# Patient Record
Sex: Female | Born: 1955 | Race: Black or African American | Hispanic: No | Marital: Single | Smoking: Former smoker
Health system: Southern US, Community
[De-identification: ages and names within clinical notes are randomized; demographics above are authoritative.]

## PROBLEM LIST (undated history)

## (undated) DIAGNOSIS — I10 Essential (primary) hypertension: Secondary | ICD-10-CM

## (undated) DIAGNOSIS — E119 Type 2 diabetes mellitus without complications: Secondary | ICD-10-CM

---

## 2010-01-18 ENCOUNTER — Emergency Department (HOSPITAL_COMMUNITY): Admission: EM | Admit: 2010-01-18 | Discharge: 2010-01-18 | Payer: Self-pay | Admitting: Emergency Medicine

## 2010-04-22 ENCOUNTER — Ambulatory Visit: Payer: Self-pay | Admitting: Internal Medicine

## 2010-04-22 ENCOUNTER — Encounter (INDEPENDENT_AMBULATORY_CARE_PROVIDER_SITE_OTHER): Payer: Self-pay | Admitting: *Deleted

## 2010-04-22 LAB — CONVERTED CEMR LAB: Microalb, Ur: 0.97 mg/dL (ref 0.00–1.89)

## 2010-07-24 ENCOUNTER — Encounter (INDEPENDENT_AMBULATORY_CARE_PROVIDER_SITE_OTHER): Payer: Self-pay | Admitting: Internal Medicine

## 2010-07-24 LAB — CONVERTED CEMR LAB
ANA Titer 1: 1:640 {titer} — ABNORMAL HIGH
AST: 12 units/L (ref 0–37)
Anti Nuclear Antibody(ANA): POSITIVE — AB
BUN: 12 mg/dL (ref 6–23)
Basophils Relative: 0 % (ref 0–1)
CO2: 20 meq/L (ref 19–32)
Chloride: 105 meq/L (ref 96–112)
Eosinophils Absolute: 0.2 10*3/uL (ref 0.0–0.7)
Eosinophils Relative: 2 % (ref 0–5)
Glucose, Bld: 86 mg/dL (ref 70–99)
HCT: 44.8 % (ref 36.0–46.0)
Hemoglobin: 14.3 g/dL (ref 12.0–15.0)
Lymphocytes Relative: 23 % (ref 12–46)
Lymphs Abs: 2.1 10*3/uL (ref 0.7–4.0)
Monocytes Absolute: 0.4 10*3/uL (ref 0.1–1.0)
Platelets: 298 10*3/uL (ref 150–400)
RBC: 5.32 M/uL — ABNORMAL HIGH (ref 3.87–5.11)
Sed Rate: 4 mm/hr (ref 0–22)
Sodium: 136 meq/L (ref 135–145)
Total Bilirubin: 0.3 mg/dL (ref 0.3–1.2)
WBC: 9 10*3/uL (ref 4.0–10.5)

## 2011-03-25 ENCOUNTER — Other Ambulatory Visit: Payer: Self-pay | Admitting: Infectious Diseases

## 2011-03-25 ENCOUNTER — Ambulatory Visit
Admission: RE | Admit: 2011-03-25 | Discharge: 2011-03-25 | Disposition: A | Discharge status: Home / Self Care | Payer: Self-pay | Source: Ambulatory Visit | Attending: Infectious Diseases | Admitting: Infectious Diseases

## 2011-03-25 DIAGNOSIS — IMO0001 Reserved for inherently not codable concepts without codable children: Secondary | ICD-10-CM

## 2013-09-22 ENCOUNTER — Encounter (HOSPITAL_COMMUNITY): Payer: Self-pay | Admitting: Emergency Medicine

## 2013-09-22 ENCOUNTER — Emergency Department (HOSPITAL_COMMUNITY)
Admission: EM | Admit: 2013-09-22 | Discharge: 2013-09-22 | Disposition: A | Discharge status: Other Acute Inpatient Hospital | Payer: PRIVATE HEALTH INSURANCE | Source: Home / Self Care | Attending: Family Medicine | Admitting: Family Medicine

## 2013-09-22 ENCOUNTER — Emergency Department (HOSPITAL_COMMUNITY)
Admission: EM | Admit: 2013-09-22 | Discharge: 2013-09-22 | Disposition: A | Discharge status: Home / Self Care | Payer: PRIVATE HEALTH INSURANCE | Attending: Emergency Medicine | Admitting: Emergency Medicine

## 2013-09-22 ENCOUNTER — Emergency Department (HOSPITAL_COMMUNITY): Payer: PRIVATE HEALTH INSURANCE

## 2013-09-22 DIAGNOSIS — H532 Diplopia: Secondary | ICD-10-CM | POA: Insufficient documentation

## 2013-09-22 DIAGNOSIS — E119 Type 2 diabetes mellitus without complications: Secondary | ICD-10-CM | POA: Insufficient documentation

## 2013-09-22 DIAGNOSIS — R0602 Shortness of breath: Secondary | ICD-10-CM

## 2013-09-22 DIAGNOSIS — I10 Essential (primary) hypertension: Secondary | ICD-10-CM | POA: Insufficient documentation

## 2013-09-22 DIAGNOSIS — Z880 Allergy status to penicillin: Secondary | ICD-10-CM | POA: Insufficient documentation

## 2013-09-22 DIAGNOSIS — R51 Headache: Secondary | ICD-10-CM | POA: Insufficient documentation

## 2013-09-22 DIAGNOSIS — R0789 Other chest pain: Secondary | ICD-10-CM

## 2013-09-22 DIAGNOSIS — Z87891 Personal history of nicotine dependence: Secondary | ICD-10-CM | POA: Insufficient documentation

## 2013-09-22 DIAGNOSIS — H538 Other visual disturbances: Secondary | ICD-10-CM | POA: Insufficient documentation

## 2013-09-22 HISTORY — DX: Type 2 diabetes mellitus without complications: E11.9

## 2013-09-22 HISTORY — DX: Essential (primary) hypertension: I10

## 2013-09-22 LAB — DIFFERENTIAL
Basophils Absolute: 0 10*3/uL (ref 0.0–0.1)
Basophils Relative: 0 % (ref 0–1)
EOS ABS: 0.2 10*3/uL (ref 0.0–0.7)
Eosinophils Relative: 2 % (ref 0–5)
LYMPHS ABS: 2.3 10*3/uL (ref 0.7–4.0)
LYMPHS PCT: 28 % (ref 12–46)
Monocytes Absolute: 0.4 10*3/uL (ref 0.1–1.0)
Monocytes Relative: 5 % (ref 3–12)
NEUTROS ABS: 5.2 10*3/uL (ref 1.7–7.7)
NEUTROS PCT: 64 % (ref 43–77)

## 2013-09-22 LAB — URINALYSIS, ROUTINE W REFLEX MICROSCOPIC
Hgb urine dipstick: NEGATIVE
Ketones, ur: 40 mg/dL — AB
LEUKOCYTES UA: NEGATIVE
Nitrite: NEGATIVE
PROTEIN: NEGATIVE mg/dL
Specific Gravity, Urine: 1.039 — ABNORMAL HIGH (ref 1.005–1.030)
UROBILINOGEN UA: 1 mg/dL (ref 0.0–1.0)
pH: 5 (ref 5.0–8.0)

## 2013-09-22 LAB — COMPREHENSIVE METABOLIC PANEL
ALK PHOS: 124 U/L — AB (ref 39–117)
ALT: 10 U/L (ref 0–35)
AST: 9 U/L (ref 0–37)
Albumin: 3.8 g/dL (ref 3.5–5.2)
BUN: 18 mg/dL (ref 6–23)
CHLORIDE: 96 meq/L (ref 96–112)
CO2: 20 mEq/L (ref 19–32)
Calcium: 10.1 mg/dL (ref 8.4–10.5)
Creatinine, Ser: 0.63 mg/dL (ref 0.50–1.10)
GFR calc Af Amer: >90 mL/min (ref >90)
GFR calc non Af Amer: >90 mL/min (ref >90)
GLUCOSE: 383 mg/dL — AB (ref 70–99)
Potassium: 4.1 mEq/L (ref 3.7–5.3)
SODIUM: 135 meq/L — AB (ref 137–147)
TOTAL PROTEIN: 7.1 g/dL (ref 6.0–8.3)
Total Bilirubin: 0.2 mg/dL — ABNORMAL LOW (ref 0.3–1.2)

## 2013-09-22 LAB — POCT I-STAT, CHEM 8
BUN: 16 mg/dL (ref 6–23)
CREATININE: 0.8 mg/dL (ref 0.50–1.10)
Calcium, Ion: 1.31 mmol/L — ABNORMAL HIGH (ref 1.12–1.23)
Chloride: 100 mEq/L (ref 96–112)
GLUCOSE: 390 mg/dL — AB (ref 70–99)
HEMATOCRIT: 47 % — AB (ref 36.0–46.0)
HEMOGLOBIN: 16 g/dL — AB (ref 12.0–15.0)
Potassium: 4.2 mEq/L (ref 3.7–5.3)
Sodium: 137 mEq/L (ref 137–147)
TCO2: 22 mmol/L (ref 0–100)

## 2013-09-22 LAB — RAPID URINE DRUG SCREEN, HOSP PERFORMED
AMPHETAMINES: NOT DETECTED
Barbiturates: NOT DETECTED
Benzodiazepines: NOT DETECTED
Cocaine: NOT DETECTED
Opiates: NOT DETECTED
TETRAHYDROCANNABINOL: NOT DETECTED

## 2013-09-22 LAB — CBC
HCT: 41.4 % (ref 36.0–46.0)
Hemoglobin: 14.5 g/dL (ref 12.0–15.0)
MCH: 29.4 pg (ref 26.0–34.0)
MCHC: 35 g/dL (ref 30.0–36.0)
MCV: 84 fL (ref 78.0–100.0)
PLATELETS: 267 10*3/uL (ref 150–400)
RBC: 4.93 MIL/uL (ref 3.87–5.11)
RDW: 13.8 % (ref 11.5–15.5)
WBC: 8.1 10*3/uL (ref 4.0–10.5)

## 2013-09-22 LAB — CBG MONITORING, ED
GLUCOSE-CAPILLARY: 325 mg/dL — AB (ref 70–99)
Glucose-Capillary: 274 mg/dL — ABNORMAL HIGH (ref 70–99)

## 2013-09-22 LAB — I-STAT TROPONIN, ED: Troponin i, poc: 0.09 ng/mL (ref 0.00–0.08)

## 2013-09-22 LAB — URINE MICROSCOPIC-ADD ON

## 2013-09-22 LAB — I-STAT CHEM 8, ED
BUN: 18 mg/dL (ref 6–23)
CHLORIDE: 101 meq/L (ref 96–112)
CREATININE: 0.8 mg/dL (ref 0.50–1.10)
Calcium, Ion: 1.27 mmol/L — ABNORMAL HIGH (ref 1.12–1.23)
Glucose, Bld: 388 mg/dL — ABNORMAL HIGH (ref 70–99)
HCT: 45 % (ref 36.0–46.0)
Hemoglobin: 15.3 g/dL — ABNORMAL HIGH (ref 12.0–15.0)
POTASSIUM: 4 meq/L (ref 3.7–5.3)
SODIUM: 138 meq/L (ref 137–147)
TCO2: 21 mmol/L (ref 0–100)

## 2013-09-22 LAB — TROPONIN I

## 2013-09-22 LAB — ETHANOL

## 2013-09-22 MED ORDER — SODIUM CHLORIDE 0.9 % IV BOLUS (SEPSIS)
1000.0000 mL | Freq: Once | INTRAVENOUS | Status: AC
  Administered 2013-09-22: 1000 mL via INTRAVENOUS

## 2013-09-22 MED ORDER — METFORMIN HCL 500 MG PO TABS: 500.0000 mg | ORAL_TABLET | Freq: Two times a day (BID) | ORAL | Status: AC

## 2013-09-22 NOTE — ED Notes (Signed)
Pt,lab resault troponin shown to dr.KOHUT

## 2013-09-22 NOTE — ED Provider Notes (Signed)
CSN: [id number]     Arrival date & time 09/22/13  1500 History   First MD Initiated Contact with Patient 09/22/13 1514     Chief Complaint  Patient presents with  . Chest Pain  . Hyperglycemia     (Consider location/radiation/quality/duration/timing/severity/associated sxs/prior Treatment) HPI Comments: Patient presents to the ED with a chief complaint of headache.  She states that she has had a headache for the past week or so.  It was gradual in onset.  She was sent to the [facility] by her family members, after she told them that she had double vision and was also having SOB.  Patient states that her vision was last normal 1 week ago.  She states that the double vision comes and goes.  She states that sometimes the vision is only blurry, but other times it is clearly double.  Additionally, she states that she has felt out of breath for the past 1-2 weeks.  There are no aggravating or alleviating factors.  She has not taken anything to alleviate her symptoms.  The history is provided by the patient. No language interpreter was used.    Past Medical History  Diagnosis Date  . Hypertension   . Diabetes mellitus without complication    History reviewed. No pertinent past surgical history. No family history on file. History  Substance Use Topics  . Smoking status: Former Games developer  . Smokeless tobacco: Not on file  . Alcohol Use: No   OB History   Grav Para Term Preterm Abortions TAB SAB Ect Mult Living                 Review of Systems  All other systems reviewed and are negative.      Allergies  Penicillins  Home Medications   Current Outpatient Rx  Name  Route  Sig  Dispense  Refill  . LISINOPRIL PO   Oral   Take by mouth.         . METFORMIN HCL PO   Oral   Take by mouth.          BP 126/93  Pulse 109  Temp(Src) 98.4 F (36.9 C) (Oral)  Resp 18  Ht 5\' 4"  (1.626 m)  SpO2 98% Physical Exam  Nursing note and vitals reviewed. Constitutional: She is oriented  to person, place, and time. She appears well-developed and well-nourished.  HENT:  Head: Normocephalic and atraumatic.  Right Ear: External ear normal.  Left Ear: External ear normal.  Eyes: Conjunctivae and EOM are normal. Pupils are equal, round, and reactive to light.  Neck: Normal range of motion. Neck supple.  No pain with neck flexion, no meningismus  Cardiovascular: Normal rate, regular rhythm and normal heart sounds.  Exam reveals no gallop and no friction rub.   No murmur heard. Pulmonary/Chest: Effort normal and breath sounds normal. No respiratory distress. She has no wheezes. She has no rales. She exhibits no tenderness.  Abdominal: Soft. Bowel sounds are normal. She exhibits no distension and no mass. There is no tenderness. There is no rebound and no guarding.  Musculoskeletal: Normal range of motion. She exhibits no edema and no tenderness.  Normal gait.  Neurological: She is alert and oriented to person, place, and time. She has normal reflexes.  CN 3-12 intact, normal finger to nose, no pronator drift, sensation and strength intact bilaterally.  Skin: Skin is warm and dry.  Psychiatric: She has a normal mood and affect. Her behavior is normal. Judgment and thought  content normal.    ED Course  Procedures (including critical care time) Results for orders placed during the hospital encounter of 09/22/13  ETHANOL      Result Value Ref Range   Alcohol, Ethyl (B) <11  0 - 11 mg/dL  CBC      Result Value Ref Range   WBC 8.1  4.0 - 10.5 K/uL   RBC 4.93  3.87 - 5.11 MIL/uL   Hemoglobin 14.5  12.0 - 15.0 g/dL   HCT 09.3  26.7 - 12.4 %   MCV 84.0  78.0 - 100.0 fL   MCH 29.4  26.0 - 34.0 pg   MCHC 35.0  30.0 - 36.0 g/dL   RDW 58.0  99.8 - 33.8 %   Platelets 267  150 - 400 K/uL  DIFFERENTIAL      Result Value Ref Range   Neutrophils Relative % 64  43 - 77 %   Neutro Abs 5.2  1.7 - 7.7 K/uL   Lymphocytes Relative 28  12 - 46 %   Lymphs Abs 2.3  0.7 - 4.0 K/uL    Monocytes Relative 5  3 - 12 %   Monocytes Absolute 0.4  0.1 - 1.0 K/uL   Eosinophils Relative 2  0 - 5 %   Eosinophils Absolute 0.2  0.0 - 0.7 K/uL   Basophils Relative 0  0 - 1 %   Basophils Absolute 0.0  0.0 - 0.1 K/uL  COMPREHENSIVE METABOLIC PANEL      Result Value Ref Range   Sodium 135 (*) 137 - 147 mEq/L   Potassium 4.1  3.7 - 5.3 mEq/L   Chloride 96  96 - 112 mEq/L   CO2 20  19 - 32 mEq/L   Glucose, Bld 383 (*) 70 - 99 mg/dL   BUN 18  6 - 23 mg/dL   Creatinine, Ser 2.50  0.50 - 1.10 mg/dL   Calcium 53.9  8.4 - 76.7 mg/dL   Total Protein 7.1  6.0 - 8.3 g/dL   Albumin 3.8  3.5 - 5.2 g/dL   AST 9  0 - 37 U/L   ALT 10  0 - 35 U/L   Alkaline Phosphatase 124 (*) 39 - 117 U/L   Total Bilirubin 0.2 (*) 0.3 - 1.2 mg/dL   GFR calc non Af Amer >90  >90 mL/min   GFR calc Af Amer >90  >90 mL/min  URINE RAPID DRUG SCREEN (HOSP PERFORMED)      Result Value Ref Range   Opiates NONE DETECTED  NONE DETECTED   Cocaine NONE DETECTED  NONE DETECTED   Benzodiazepines NONE DETECTED  NONE DETECTED   Amphetamines NONE DETECTED  NONE DETECTED   Tetrahydrocannabinol NONE DETECTED  NONE DETECTED   Barbiturates NONE DETECTED  NONE DETECTED  URINALYSIS, ROUTINE W REFLEX MICROSCOPIC      Result Value Ref Range   Color, Urine YELLOW  YELLOW   APPearance CLEAR  CLEAR   Specific Gravity, Urine 1.039 (*) 1.005 - 1.030   pH 5.0  5.0 - 8.0   Glucose, UA >1000 (*) NEGATIVE mg/dL   Hgb urine dipstick NEGATIVE  NEGATIVE   Bilirubin Urine MODERATE (*) NEGATIVE   Ketones, ur 40 (*) NEGATIVE mg/dL   Protein, ur NEGATIVE  NEGATIVE mg/dL   Urobilinogen, UA 1.0  0.0 - 1.0 mg/dL   Nitrite NEGATIVE  NEGATIVE   Leukocytes, UA NEGATIVE  NEGATIVE  TROPONIN I      Result Value Ref Range  Troponin I <0.30  <0.30 ng/mL  URINE MICROSCOPIC-ADD ON      Result Value Ref Range   Squamous Epithelial / LPF FEW (*) RARE   Bacteria, UA FEW (*) RARE   Casts HYALINE CASTS (*) NEGATIVE  I-STAT TROPOININ, ED       Result Value Ref Range   Troponin i, poc 0.09 (*) 0.00 - 0.08 ng/mL   Comment NOTIFIED PHYSICIAN     Comment 3           CBG MONITORING, ED      Result Value Ref Range   Glucose-Capillary 325 (*) 70 - 99 mg/dL   Comment 1 Notify RN     Comment 2 Documented in Chart    I-STAT CHEM 8, ED      Result Value Ref Range   Sodium 138  137 - 147 mEq/L   Potassium 4.0  3.7 - 5.3 mEq/L   Chloride 101  96 - 112 mEq/L   BUN 18  6 - 23 mg/dL   Creatinine, Ser 0.26  0.50 - 1.10 mg/dL   Glucose, Bld 378 (*) 70 - 99 mg/dL   Calcium, Ion 5.88 (*) 1.12 - 1.23 mmol/L   TCO2 21  0 - 100 mmol/L   Hemoglobin 15.3 (*) 12.0 - 15.0 g/dL   HCT 50.2  77.4 - 12.8 %  CBG MONITORING, ED      Result Value Ref Range   Glucose-Capillary 274 (*) 70 - 99 mg/dL   Dg Chest 2 View  7/86/7672   CLINICAL DATA:  Chest pain and shortness of breath  EXAM: CHEST  2 VIEW  COMPARISON:  DG CHEST 1 VIEW dated 03/25/2011  FINDINGS: The heart size and mediastinal contours are within normal limits. Both lungs are clear. The visualized skeletal structures are unremarkable.  IMPRESSION: No active cardiopulmonary disease.   Electronically Signed   By: Christiana Pellant M.D.   On: 09/22/2013 16:06   Ct Head Wo Contrast  09/22/2013   CLINICAL DATA:  Headache, dizziness, blurred vision  EXAM: CT HEAD WITHOUT CONTRAST  TECHNIQUE: Contiguous axial images were obtained from the base of the skull through the vertex without intravenous contrast.  COMPARISON:  None.  FINDINGS: No acute hemorrhage, infarct, or mass lesion is identified. No midline shift. Ventricles are normal in size. Orbits and paranasal sinuses are unremarkable. No skull fracture. Probable cerumen in the left greater than right external auditory canals. Minimal ethmoid mucoperiosteal thickening. Orbits are unremarkable in their visualized aspects.  IMPRESSION: No acute intracranial findings.   Electronically Signed   By: Christiana Pellant M.D.   On: 09/22/2013 16:16      EKG  Interpretation None     ED ECG REPORT  I personally interpreted this EKG   Date: 09/22/2013   Rate: 103  Rhythm: sinus tachycardia  QRS Axis: normal  Intervals: normal  ST/T Wave abnormalities: nonspecific ST/T changes  Conduction Disutrbances:none  Narrative Interpretation:   Old EKG Reviewed: none available     MDM   Final diagnoses:  Blurred vision  SOB (shortness of breath)    Patient with headache, intermittent double vision, and SOB.  Will check labs, head CT, CXR, EKG, and re-evaluate.  Concern for stroke given the new double vision.  However, she was last normal 1 week ago, which excludes her from TPA.  7:05 PM CT is negative.  Glucose has decreased with fluids. I-STAT troponin was 0.09, laboratory troponin was negative. Patient denies any chest pain currently. Other  labs are unremarkable.  Patient's workup and labs discussed and reviewed with Dr. Juleen China.  Do not think the patient needs MRI, or further workup at this time. Dr. Juleen China also personally saw the patient, and recommends discharge to home with PCP followup. Also recommends increasing metformin to 500 twice a day. Patient understands and agrees with plan. She is stable and ready for discharge.    Roxy Horseman, PA-C 09/22/13 1909

## 2013-09-22 NOTE — ED Notes (Signed)
Pt sent from urgent care for further eval of center chest pain ongoing for 1-2 weeks and elevated Blood sugar. CBG 390 at urgent care.

## 2013-09-22 NOTE — ED Provider Notes (Signed)
CSN: [id number]     Arrival date & time 09/22/13  1308 History   First MD Initiated Contact with Patient 09/22/13 1344     Chief Complaint  Patient presents with  . Headache   (Consider location/radiation/quality/duration/timing/severity/associated sxs/prior Treatment) Patient is a 58 y.o. female presenting with chest pain. The history is provided by the patient.  Chest Pain Pain location:  Substernal area Pain quality: burning and tightness   Pain radiates to:  Does not radiate Pain radiates to the back: no   Pain severity:  Moderate Duration:  1 week Timing:  Intermittent Progression:  Waxing and waning Chronicity:  New Associated symptoms: no abdominal pain, no heartburn, no nausea, no palpitations and not vomiting   Risk factors: diabetes mellitus and smoking   Risk factors comment:  Stopped smoking 2 wks ago.   Past Medical History  Diagnosis Date  . Hypertension   . Diabetes mellitus without complication    History reviewed. No pertinent past surgical history. History reviewed. No pertinent family history. History  Substance Use Topics  . Smoking status: Current Every Day Smoker  . Smokeless tobacco: Not on file  . Alcohol Use: No   OB History   Grav Para Term Preterm Abortions TAB SAB Ect Mult Living                 Review of Systems  Constitutional: Negative.   Cardiovascular: Positive for chest pain. Negative for palpitations and leg swelling.  Gastrointestinal: Negative for heartburn, nausea, vomiting and abdominal pain.  Skin: Negative.     Allergies  Penicillins  Home Medications   Current Outpatient Rx  Name  Route  Sig  Dispense  Refill  . LISINOPRIL PO   Oral   Take by mouth.         . METFORMIN HCL PO   Oral   Take by mouth.          BP 113/79  Pulse 98  Temp(Src) 98.8 F (37.1 C) (Oral)  Resp 22  SpO2 96% Physical Exam  Nursing note and vitals reviewed. Constitutional: She is oriented to person, place, and time. She appears  well-developed and well-nourished.  HENT:  Mouth/Throat: Oropharynx is clear and moist.  Neck: Normal range of motion. Neck supple.  Cardiovascular: Normal rate, regular rhythm, normal heart sounds and intact distal pulses.   Pulmonary/Chest: Effort normal and breath sounds normal. She exhibits no tenderness.  Abdominal: Soft. Bowel sounds are normal.  Lymphadenopathy:    She has no cervical adenopathy.  Neurological: She is alert and oriented to person, place, and time.  Skin: Skin is warm and dry.    ED Course  Procedures (including critical care time) Labs Review Labs Reviewed  POCT I-STAT, CHEM 8 - Abnormal; Notable for the following:    Glucose, Bld 390 (*)    Calcium, Ion 1.31 (*)    Hemoglobin 16.0 (*)    HCT 47.0 (*)    All other components within normal limits   Imaging Review No results found.   MDM   1. Atypical chest pain    Sent for eval of cp -new onset , diabetic, smoker, bs 390, abnl ekg-st changes.    Linna Hoff, MD 09/22/13 [phone]

## 2013-09-22 NOTE — Discharge Instructions (Signed)
Shortness of Breath Shortness of breath means you have trouble breathing. Shortness of breath may indicate that you have a medical problem. You should seek immediate medical care for shortness of breath. CAUSES   Not enough oxygen in the air (as with high altitudes or a smoke-filled room).  Short-term (acute) lung disease, including:  Infections, such as pneumonia.  Fluid in the lungs, such as heart failure.  A blood clot in the lungs (pulmonary embolism).  Long-term (chronic) lung diseases.  Heart disease (heart attack, angina, heart failure, and others).  Low red blood cells (anemia).  Poor physical fitness. This can cause shortness of breath when you exercise.  Chest or back injuries or stiffness.  Being overweight.  Smoking.  Anxiety. This can make you feel like you are not getting enough air. DIAGNOSIS  Serious medical problems can usually be found during your physical exam. Tests may also be done to determine why you are having shortness of breath. Tests may include:  Chest X-rays.  Lung function tests.  Blood tests.  Electrocardiography.  Exercise testing.  Echocardiography.  Imaging scans. Your caregiver may not be able to find a cause for your shortness of breath after your exam. In this case, it is important to have a follow-up exam with your caregiver as directed.  TREATMENT  Treatment for shortness of breath depends on the cause of your symptoms and can vary greatly. HOME CARE INSTRUCTIONS   Do not smoke. Smoking is a common cause of shortness of breath. If you smoke, ask for help to quit.  Avoid being around chemicals or things that may bother your breathing, such as paint fumes and dust.  Rest as needed. Slowly resume your usual activities.  If medicines were prescribed, take them as directed for the full length of time directed. This includes oxygen and any inhaled medicines.  Keep all follow-up appointments as directed by your caregiver. SEEK  MEDICAL CARE IF:   Your condition does not improve in the time expected.  You have a hard time doing your normal activities even with rest.  You have any side effects or problems with the medicines prescribed.  You develop any new symptoms. SEEK IMMEDIATE MEDICAL CARE IF:   Your shortness of breath gets worse.  You feel lightheaded, faint, or develop a cough not controlled with medicines.  You start coughing up blood.  You have pain with breathing.  You have chest pain or pain in your arms, shoulders, or abdomen.  You have a fever.  You are unable to walk up stairs or exercise the way you normally do. MAKE SURE YOU:  Understand these instructions.  Will watch your condition.  Will get help right away if you are not doing well or get worse. Document Released: 03/23/2001 Document Revised: 12/28/2011 Document Reviewed: 09/13/2011 [facility] Patient Information 2014 [place], [place].  Visual Disturbances You have had a disturbance in your vision. This may be caused by various conditions, such as:  Migraines. Migraine headaches are often preceded by a disturbance in vision. Blind spots or light flashes are followed by a headache. This type of visual disturbance is temporary. It does not damage the eye.  Glaucoma. This is caused by increased pressure in the eye. Symptoms include haziness, blurred vision, or seeing rainbow colored circles when looking at bright lights. Partial or complete visual loss can occur. You may or may not experience eye pain. Visual loss may be gradual or sudden and is irreversible. Glaucoma is the leading cause of blindness.  Retina problems. Vision will be reduced if the retina becomes detached or if there is a circulation problem as with diabetes, high blood pressure, or a mini-stroke. Symptoms include seeing "floaters," flashes of light, or shadows, as if a curtain has fallen over your eye.  Optic nerve problems. The main nerve in your eye can be damaged  by redness, soreness, and swelling (inflammation), poor circulation, drugs, and toxins. It is very important to have a complete exam done by a specialist to determine the exact cause of your eye problem. The specialist may recommend medicines or surgery, depending on the cause of the problem. This can help prevent further loss of vision or reduce the risk of having a stroke. Contact the caregiver to whom you have been referred and arrange for follow-up care right away. SEEK IMMEDIATE MEDICAL CARE IF:   Your vision gets worse.  You develop severe headaches.  You have any weakness or numbness in the face, arms, or legs.  You have any trouble speaking or walking. Document Released: 08/05/2004 Document Revised: 09/20/2011 Document Reviewed: 11/26/2009 [facility] Patient Information 2014 [place], [place].

## 2013-09-22 NOTE — ED Notes (Signed)
Pt reports     Headache      Dizzy       Blurred vision               She  Reports       She  Took  Her  meds  This  Am            Had  Chest  Pains  Earlier  No  Chest  Pain  At  This  Time

## 2013-09-25 NOTE — ED Provider Notes (Signed)
Medical screening examination/treatment/procedure(s) were performed by non-physician practitioner and as supervising physician I was immediately available for consultation/collaboration.   EKG Interpretation   Date/Time:  Saturday September 22 2013 15:14:23 EDT Ventricular Rate:  103 PR Interval:  138 QRS Duration: 78 QT Interval:  316 QTC Calculation: 413 R Axis:   67 Text Interpretation:  Sinus tachycardia Non-specific ST-t changes since  last tracing no significant change Confirmed by Juleen China  MD, Blondina Coderre ([id number])  on 09/22/2013 4:25:52 PM       Raeford Razor, MD 09/25/13 2313

## 2014-08-09 ENCOUNTER — Other Ambulatory Visit (HOSPITAL_COMMUNITY): Payer: Self-pay | Admitting: Internal Medicine

## 2014-08-09 ENCOUNTER — Ambulatory Visit (HOSPITAL_COMMUNITY)
Admission: RE | Admit: 2014-08-09 | Discharge: 2014-08-09 | Disposition: A | Discharge status: Home / Self Care | Payer: PRIVATE HEALTH INSURANCE | Source: Ambulatory Visit | Attending: Internal Medicine | Admitting: Internal Medicine

## 2014-08-09 DIAGNOSIS — R7611 Nonspecific reaction to tuberculin skin test without active tuberculosis: Secondary | ICD-10-CM | POA: Insufficient documentation

## 2014-08-09 DIAGNOSIS — A159 Respiratory tuberculosis unspecified: Secondary | ICD-10-CM

## 2015-10-18 IMAGING — CT CT HEAD W/O CM
1 of 2 series · 16 of 30 positions shown, 20 images · non-contrast
Comparison: None.

CLINICAL DATA: Headache, dizziness, blurred vision

EXAM:
CT HEAD WITHOUT CONTRAST
TECHNIQUE: Contiguous axial images were obtained from the base of the skull
through the vertex without intravenous contrast.

[Series 3: head 2.0 h70h · axial · 0.44mm/px · z∈[+1285,+1409]mm · 16 of 70 slices shown, 20 images]
[im 4/70  brain]
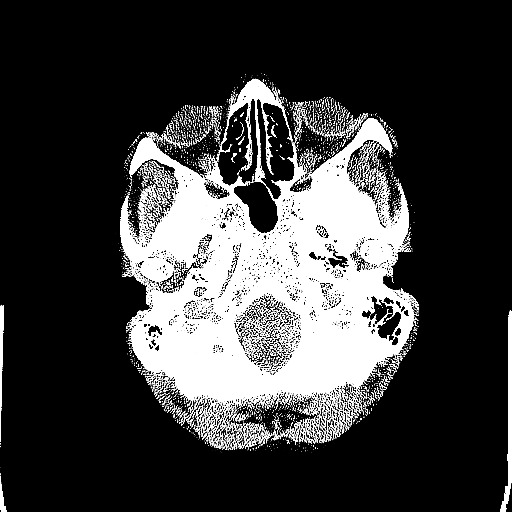
[im 4/70  bone]
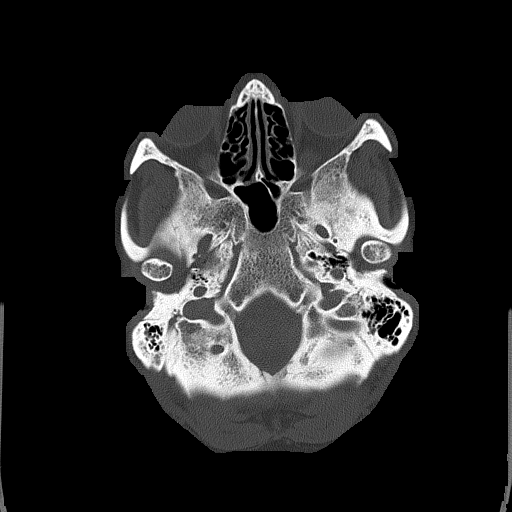
[im 7/70  brain]
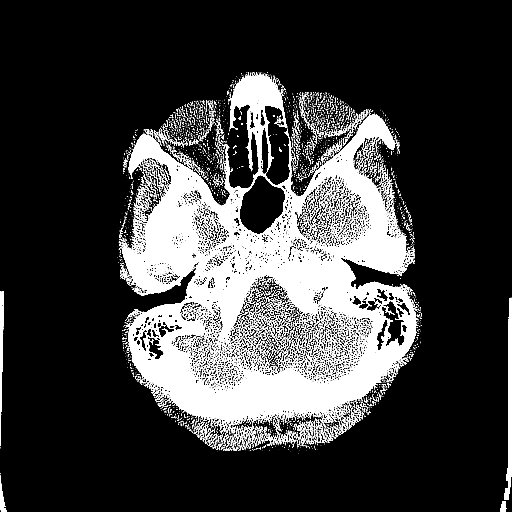
[im 11/70  brain]
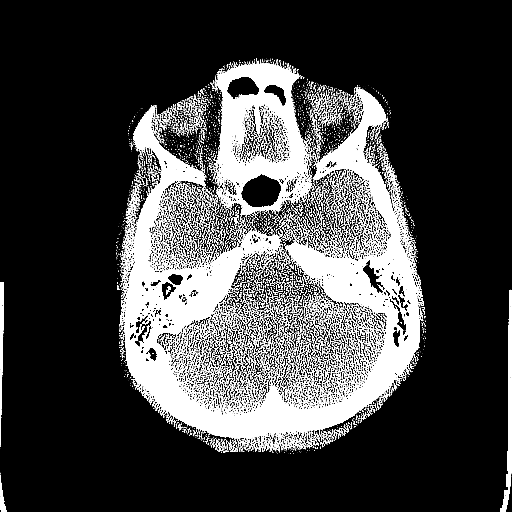
[im 18/70  brain]
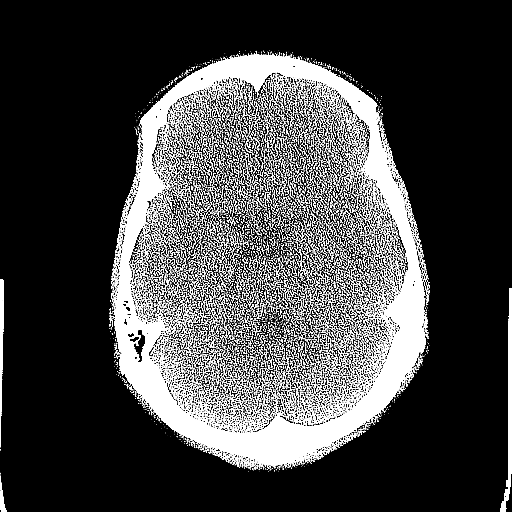
[im 21/70  brain]
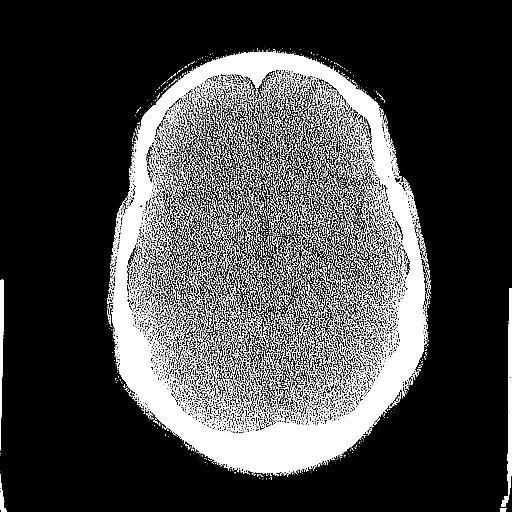
[im 21/70  bone]
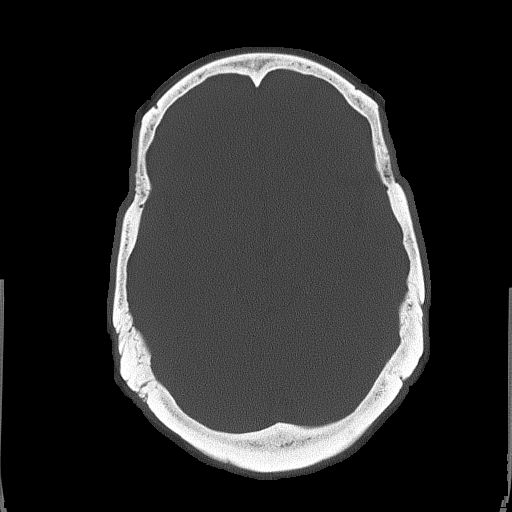
[im 25/70  brain]
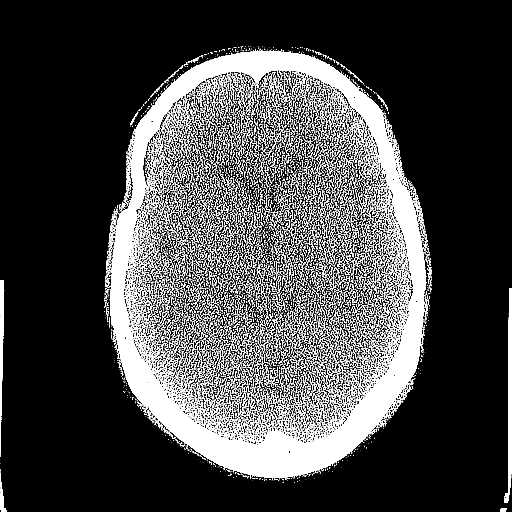
[im 28/70  brain]
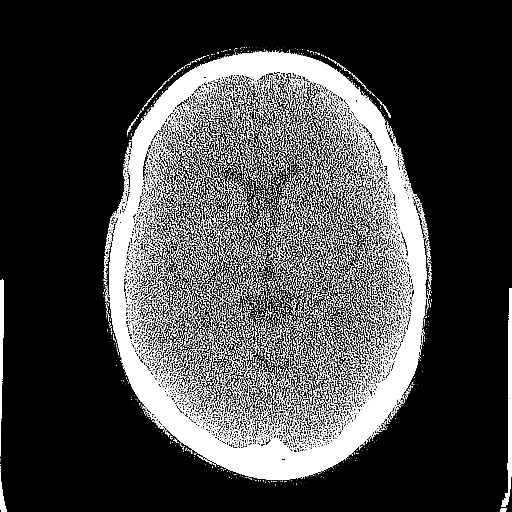
[im 32/70  brain]
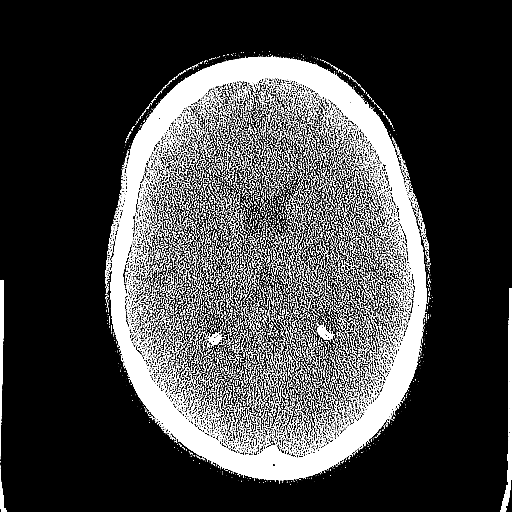
[im 38/70  brain]
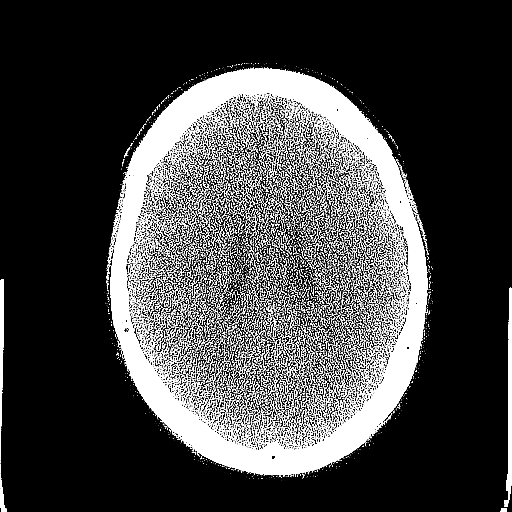
[im 38/70  bone]
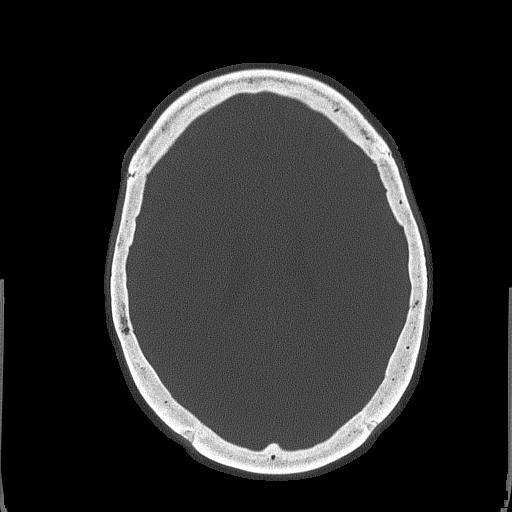
[im 42/70  brain]
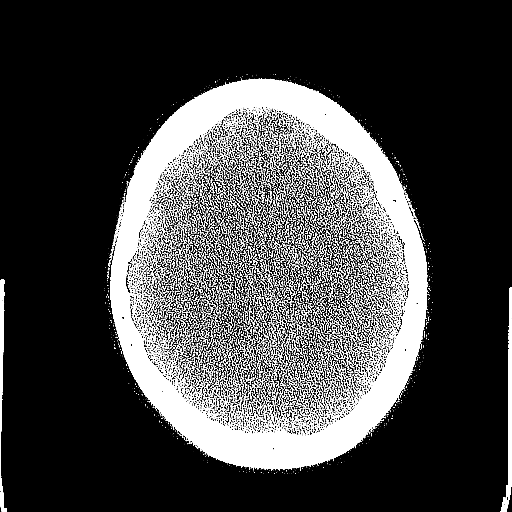
[im 45/70  brain]
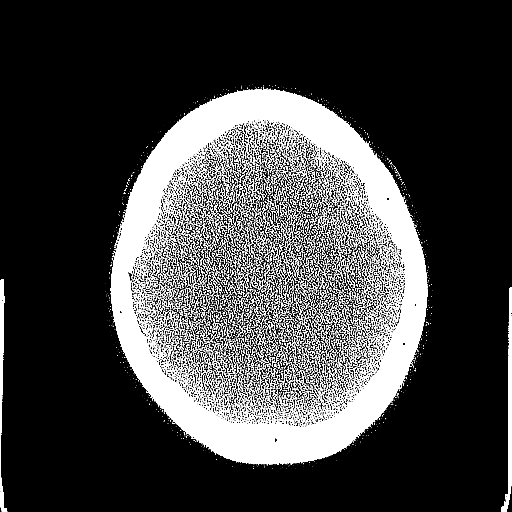
[im 49/70  brain]
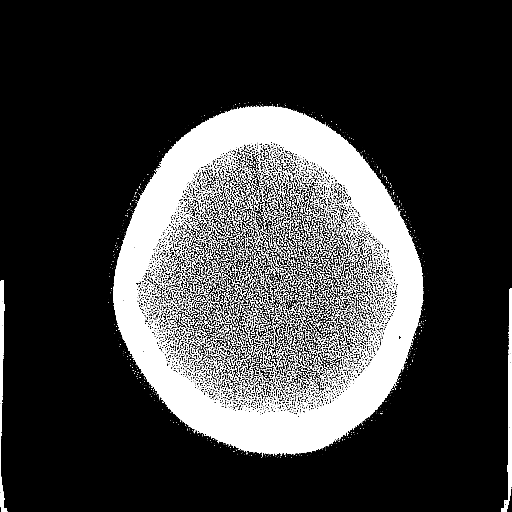
[im 52/70  brain]
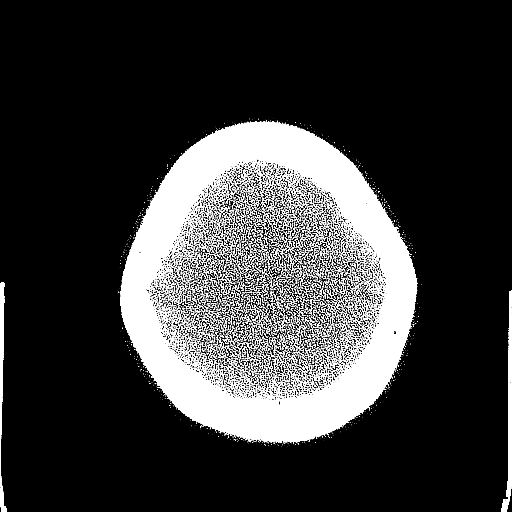
[im 52/70  bone]
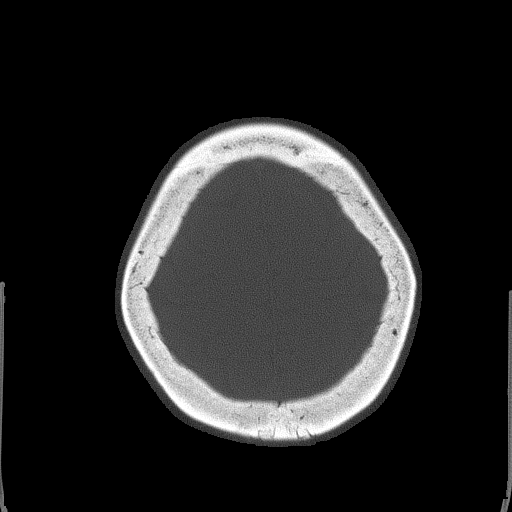
[im 59/70  brain]
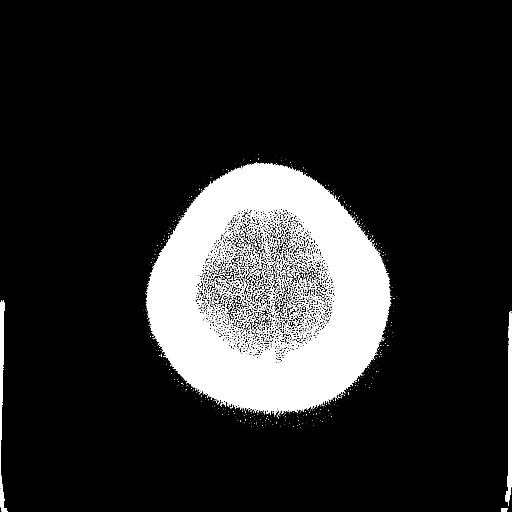
[im 63/70  brain]
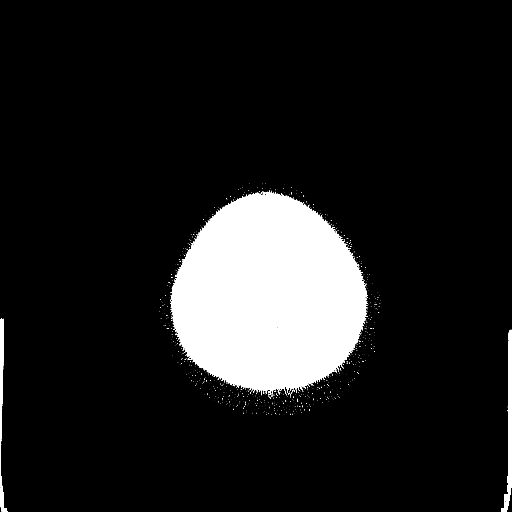
[im 66/70  brain]
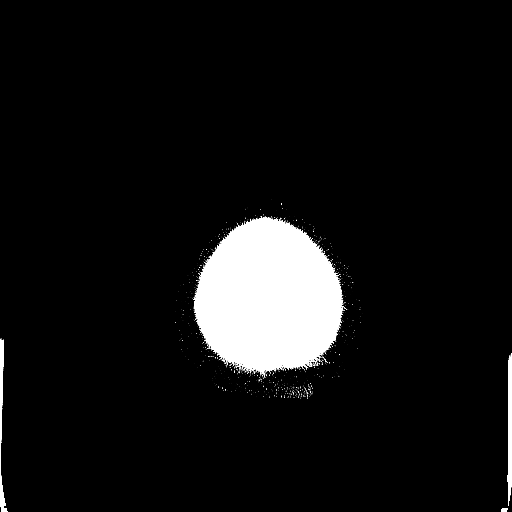

[16 of 30 positions shown; findings below may reference images not displayed]

FINDINGS: No acute hemorrhage, infarct, or mass lesion is identified. No
midline shift. Ventricles are normal in size. Orbits and paranasal
sinuses are unremarkable. No skull fracture. Probable cerumen in the
left greater than right external auditory canals. Minimal ethmoid
mucoperiosteal thickening. Orbits are unremarkable in their
visualized aspects.
IMPRESSION: No acute intracranial findings.

## 2016-09-03 IMAGING — DX DG CHEST 2V
2 series · 2 of 2 positions shown · non-contrast
Comparison: PA and lateral chest x-ray November 22, 2013

CLINICAL DATA: Positive tuberculin skin test many years ago

EXAM:
CHEST  2 VIEW

[chest pa]
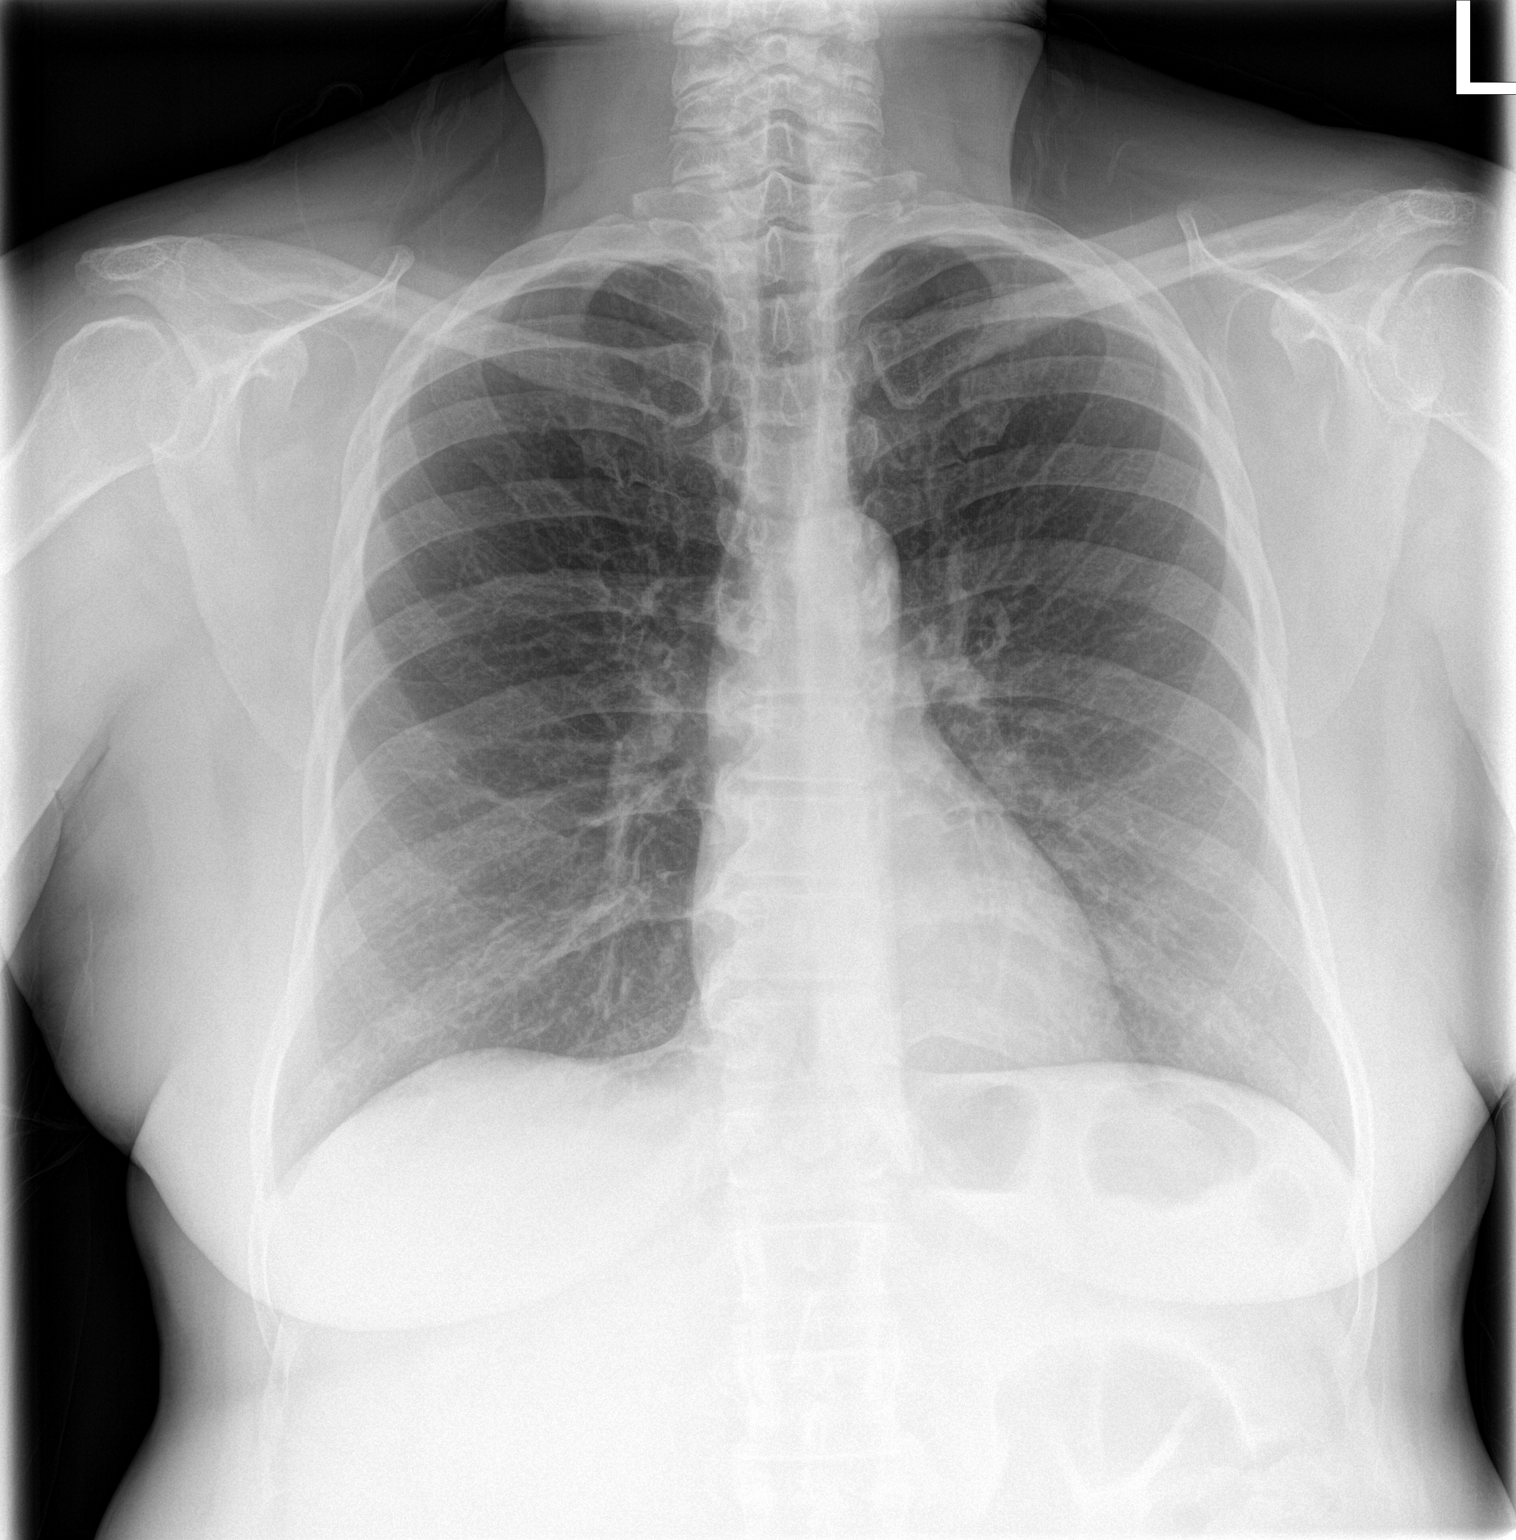

[chest lat]
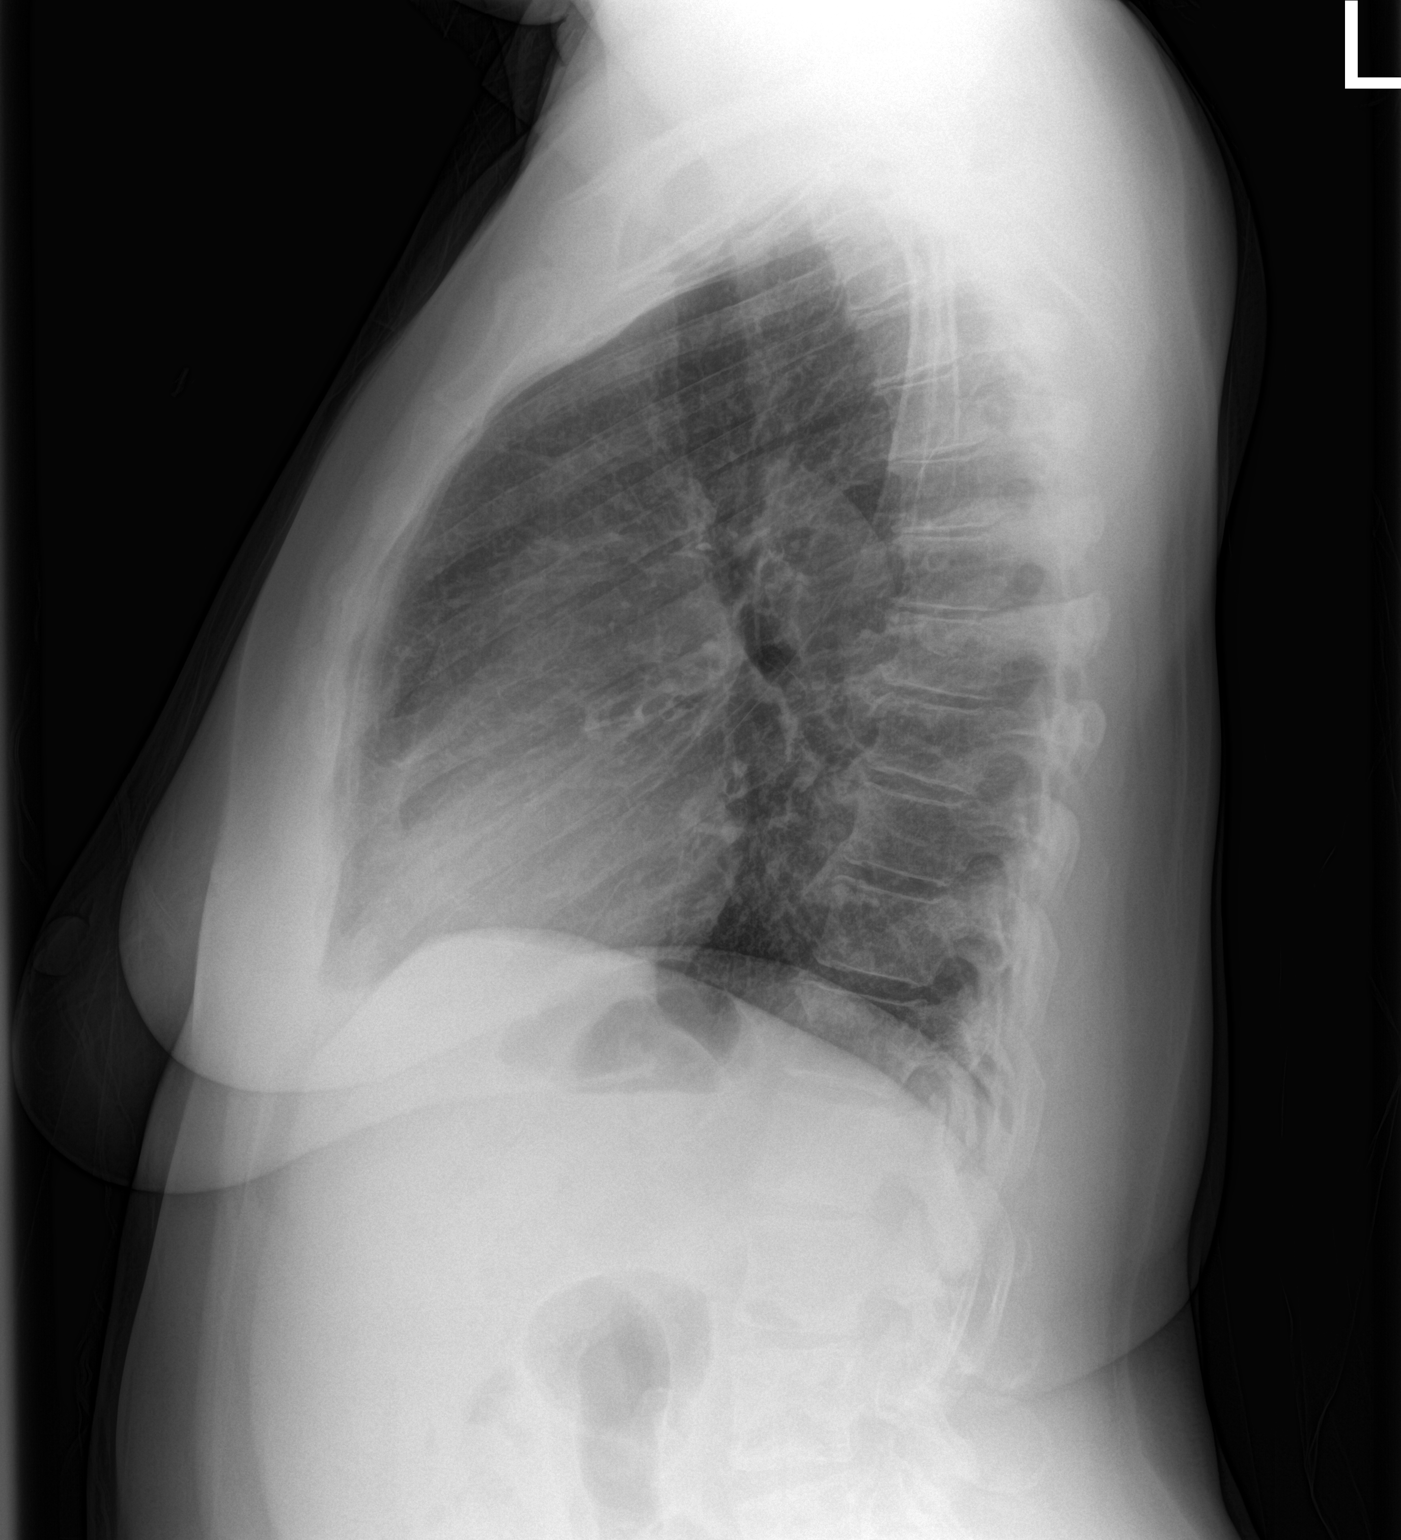

[2 of 2 positions shown; findings below may reference images not displayed]

FINDINGS: The lungs are well-expanded and clear. The heart and pulmonary
vascularity are normal. The mediastinum is normal in width. The
trachea is midline. The bony thorax is unremarkable.
IMPRESSION: There is no active cardiopulmonary disease. There are no findings to
suggest old tuberculous infection.

## 2016-10-19 ENCOUNTER — Ambulatory Visit: Payer: Medicaid Other | Admitting: Internal Medicine

## 2016-10-19 DIAGNOSIS — I10 Essential (primary) hypertension: Secondary | ICD-10-CM | POA: Insufficient documentation

## 2016-10-19 DIAGNOSIS — D510 Vitamin B12 deficiency anemia due to intrinsic factor deficiency: Secondary | ICD-10-CM | POA: Insufficient documentation

## 2016-10-19 DIAGNOSIS — M069 Rheumatoid arthritis, unspecified: Secondary | ICD-10-CM | POA: Insufficient documentation

## 2016-10-19 DIAGNOSIS — E119 Type 2 diabetes mellitus without complications: Secondary | ICD-10-CM | POA: Insufficient documentation

## 2016-10-19 DIAGNOSIS — R7611 Nonspecific reaction to tuberculin skin test without active tuberculosis: Secondary | ICD-10-CM | POA: Insufficient documentation

## 2016-11-11 ENCOUNTER — Encounter: Payer: Self-pay | Admitting: Internal Medicine

## 2016-11-11 ENCOUNTER — Ambulatory Visit (INDEPENDENT_AMBULATORY_CARE_PROVIDER_SITE_OTHER): Payer: BC Managed Care – PPO | Admitting: Internal Medicine

## 2016-11-11 DIAGNOSIS — R7611 Nonspecific reaction to tuberculin skin test without active tuberculosis: Secondary | ICD-10-CM

## 2016-11-11 NOTE — Assessment & Plan Note (Signed)
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 She does not need any further testing for TB or any further treatment.

## 2016-11-11 NOTE — Progress Notes (Signed)
[facility]  Patient Active Problem List   Diagnosis Date Noted  . 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 Rheumatoid arthritis (HCC) 10/19/2016  . Hypertension 10/19/2016  . Type 2 diabetes mellitus (HCC) 10/19/2016  . Pernicious anemia 10/19/2016    Patient's Medications  New Prescriptions   No medications on file  Previous Medications   [employer] B-12 1000 MCG TBCR    Take 1 tablet by mouth daily.   DHA-EPA-VITAMIN E (OMEGA-3 COMPLEX PO)    Take by mouth.   FAMOTIDINE (PEPCID) 20 MG TABLET       FERROUS SULFATE 325 (65 FE) MG TABLET    1 TABLET ONCE A DAY ORALLY 90 DAYS   GLIMEPIRIDE (AMARYL) 2 MG TABLET       INFLIXIMAB (REMICADE IV)    Inject into the vein.   LISINOPRIL (PRINIVIL,ZESTRIL) 40 MG TABLET       METFORMIN (GLUCOPHAGE) 500 MG TABLET    Take 1 tablet (500 mg total) by mouth 2 (two) times daily with a meal.   NAPROXEN SODIUM (ANAPROX) 220 MG TABLET    Take 220 mg by mouth.  Modified Medications   No medications on file  Discontinued Medications   BIOTIN PO    Take 1 each by mouth daily.   ESOMEPRAZOLE (NEXIUM) 20 MG CAPSULE    Take 20 mg by mouth every evening.   LISINOPRIL (PRINIVIL,ZESTRIL) 10 MG TABLET    Take 10 mg by mouth.   LISINOPRIL (PRINIVIL,ZESTRIL) 20 MG TABLET    Take 20 mg by mouth daily.    Subjective: Ms. Kimberly Cain is referred by her primary care provider, Dr. Johnella Moloney, because of a positive TB test. 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 She states that over 20 years ago she developed a positive PPD skin test. She received 2 different medications for 1 year. Thereafter she was followed with annual chest x-rays which have always been negative. She has rheumatoid arthritis and has been receiving intermittent courses of prednisone. She states that she had a skin test placed again recently. She says that there was some reaction but it was uncertain if it was still  positive. She had a blood test done. She tells me that her rheumatologist said it was negative. She started back on Remicade injections last month. I do not have the results of any of her recent skin test or QuantiFERON assays.  Review of Systems: Review of Systems  Constitutional: Negative for chills, diaphoresis, fever and weight loss.  Respiratory: Negative for cough, sputum production and shortness of breath.   Musculoskeletal: Positive for joint pain.    Past Medical History:  Diagnosis Date  . Diabetes mellitus without complication (HCC)   . Hypertension     Social History  Substance Use Topics  . Smoking status: Former Games developer  . Smokeless tobacco: Never Used  . Alcohol use No    No family history on file.  Allergies  Allergen Reactions  . Penicillins Swelling and Other (See Comments)    Also syncope     Objective: Vitals:   11/11/16 0903  BP: (!) 144/81  Pulse: 81  Temp: 97.9 F (36.6 C)  TempSrc: Oral  Weight: 168 lb (76.2 kg)  Height: 5\' 5"  (1.651 m)   Body mass index is 27.96 kg/m.  Physical Exam  Constitutional: She is oriented to person, place, and  time.  She is smiling and in good spirits.  Cardiovascular: Normal rate and regular rhythm.   No murmur heard. Pulmonary/Chest: Effort normal and breath sounds normal. She has no wheezes. She has no rales.  Musculoskeletal:  Swollen tender joints in both hands.  Neurological: She is alert and oriented to person, place, and time.  Psychiatric: Mood and affect normal.    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 She does not need any further testing for TB or any further treatment.          Cliffton Asters, MD [facility] [phone] pager   [phone] cell 11/11/2016, 9:21 AM

## 2019-09-10 ENCOUNTER — Ambulatory Visit: Payer: BC Managed Care – PPO | Attending: Internal Medicine

## 2022-03-04 ENCOUNTER — Ambulatory Visit: Payer: BC Managed Care – PPO | Admitting: Podiatry

## 2022-03-04 ENCOUNTER — Ambulatory Visit (INDEPENDENT_AMBULATORY_CARE_PROVIDER_SITE_OTHER): Payer: BC Managed Care – PPO

## 2022-03-04 DIAGNOSIS — M7750 Other enthesopathy of unspecified foot: Secondary | ICD-10-CM

## 2022-03-04 DIAGNOSIS — M7741 Metatarsalgia, right foot: Secondary | ICD-10-CM | POA: Diagnosis not present

## 2022-03-04 DIAGNOSIS — M7742 Metatarsalgia, left foot: Secondary | ICD-10-CM | POA: Diagnosis not present

## 2022-03-04 DIAGNOSIS — M7752 Other enthesopathy of left foot: Secondary | ICD-10-CM

## 2022-03-04 DIAGNOSIS — M7751 Other enthesopathy of right foot: Secondary | ICD-10-CM

## 2022-03-04 DIAGNOSIS — G6289 Other specified polyneuropathies: Secondary | ICD-10-CM

## 2022-03-07 NOTE — Progress Notes (Signed)
  Subjective:  Patient ID: Kimberly Cain, female    DOB: Mar 04, 1956,  MRN: [id number]  Chief Complaint  Patient presents with   Foot Pain     Bilateral foot pain Referring Provider: Thana Ates    66 y.o. female presents with the above complaint. History confirmed with patient.  She has pain in her feet that starts in the ball of the foot and radiates into the toes and goes numb.  She works 2 jobs and is on her feet for a long time each day.  She has diabetes and rheumatoid arthritis  Objective:  Physical Exam: warm, good capillary refill, no trophic changes or ulcerative lesions, normal DP and PT pulses, and normal sensory exam.  Diffuse nonspecific tenderness across the ball of the foot in the metatarsal area.  No evidence of neuroma.  No instability.  Monofilament exam normal.    Radiographs: Multiple views x-ray of both feet: no fracture, dislocation, swelling or degenerative changes noted, hallux valgus deformity, and pes planus Assessment:   1. Metatarsalgia of both feet   2. Other polyneuropathy      Plan:  Patient was evaluated and treated and all questions answered.   We reviewed her radiographs.  Discussed the possibility of this being diabetic neuropathy which is likely contributing factor.  She has her A1c has been elevated recently.  She does work on [employer] may also be a factor here.  Discussed offloading with orthotics.  We discussed prefabricated and custom molded orthoses.  She was fitted for the orthotics today but her daughter and her would like to check to make sure her insurance will cover this and this information was given to them so they can check.  She will let me know if she would like to proceed.  No follow-ups on file.
